# Patient Record
Sex: Female | Born: 1975 | Race: Black or African American | Hispanic: No | Marital: Married | Smoking: Never smoker
Health system: Southern US, Community
[De-identification: ages and names within clinical notes are randomized; demographics above are authoritative.]

## PROBLEM LIST (undated history)

## (undated) DIAGNOSIS — I10 Essential (primary) hypertension: Secondary | ICD-10-CM

## (undated) DIAGNOSIS — Z8742 Personal history of other diseases of the female genital tract: Secondary | ICD-10-CM

## (undated) HISTORY — DX: Essential (primary) hypertension: I10

## (undated) HISTORY — DX: Personal history of other diseases of the female genital tract: Z87.42

---

## 1997-08-30 ENCOUNTER — Other Ambulatory Visit: Admission: RE | Admit: 1997-08-30 | Discharge: 1997-08-30 | Payer: Self-pay | Admitting: Obstetrics and Gynecology

## 1998-08-28 ENCOUNTER — Other Ambulatory Visit: Admission: RE | Admit: 1998-08-28 | Discharge: 1998-08-28 | Payer: Self-pay | Admitting: *Deleted

## 1998-10-07 ENCOUNTER — Emergency Department (HOSPITAL_COMMUNITY): Admission: EM | Admit: 1998-10-07 | Discharge: 1998-10-07 | Payer: Self-pay

## 1999-07-02 ENCOUNTER — Encounter: Admission: RE | Admit: 1999-07-02 | Discharge: 1999-07-02 | Payer: Self-pay | Admitting: *Deleted

## 1999-08-29 ENCOUNTER — Other Ambulatory Visit: Admission: RE | Admit: 1999-08-29 | Discharge: 1999-08-29 | Payer: Self-pay | Admitting: Obstetrics and Gynecology

## 2000-08-28 ENCOUNTER — Other Ambulatory Visit: Admission: RE | Admit: 2000-08-28 | Discharge: 2000-08-28 | Payer: Self-pay | Admitting: Obstetrics and Gynecology

## 2001-09-09 ENCOUNTER — Other Ambulatory Visit: Admission: RE | Admit: 2001-09-09 | Discharge: 2001-09-09 | Payer: Self-pay | Admitting: Obstetrics and Gynecology

## 2002-04-06 ENCOUNTER — Inpatient Hospital Stay (HOSPITAL_COMMUNITY): Admission: AD | Admit: 2002-04-06 | Discharge: 2002-04-06 | Payer: Self-pay | Admitting: Obstetrics and Gynecology

## 2002-04-07 ENCOUNTER — Inpatient Hospital Stay (HOSPITAL_COMMUNITY): Admission: AD | Admit: 2002-04-07 | Discharge: 2002-04-09 | Payer: Self-pay | Admitting: Obstetrics and Gynecology

## 2002-12-06 ENCOUNTER — Other Ambulatory Visit: Admission: RE | Admit: 2002-12-06 | Discharge: 2002-12-06 | Payer: Self-pay | Admitting: Obstetrics and Gynecology

## 2003-12-20 ENCOUNTER — Other Ambulatory Visit: Admission: RE | Admit: 2003-12-20 | Discharge: 2003-12-20 | Payer: Self-pay | Admitting: Obstetrics and Gynecology

## 2005-01-03 ENCOUNTER — Other Ambulatory Visit: Admission: RE | Admit: 2005-01-03 | Discharge: 2005-01-03 | Payer: Self-pay | Admitting: Obstetrics and Gynecology

## 2005-11-15 ENCOUNTER — Emergency Department (HOSPITAL_COMMUNITY): Admission: EM | Admit: 2005-11-15 | Discharge: 2005-11-15 | Payer: Self-pay | Admitting: Family Medicine

## 2006-02-05 ENCOUNTER — Other Ambulatory Visit: Admission: RE | Admit: 2006-02-05 | Discharge: 2006-02-05 | Payer: Self-pay | Admitting: Obstetrics and Gynecology

## 2006-06-09 ENCOUNTER — Inpatient Hospital Stay (HOSPITAL_COMMUNITY): Admission: AD | Admit: 2006-06-09 | Discharge: 2006-06-09 | Payer: Self-pay | Admitting: Obstetrics and Gynecology

## 2006-07-16 ENCOUNTER — Inpatient Hospital Stay (HOSPITAL_COMMUNITY): Admission: AD | Admit: 2006-07-16 | Discharge: 2006-07-19 | Payer: Self-pay | Admitting: Obstetrics and Gynecology

## 2007-04-09 HISTORY — PX: ENDOMETRIAL ABLATION: SHX621

## 2008-03-01 ENCOUNTER — Encounter (INDEPENDENT_AMBULATORY_CARE_PROVIDER_SITE_OTHER): Payer: Self-pay | Admitting: Obstetrics and Gynecology

## 2008-03-01 ENCOUNTER — Ambulatory Visit (HOSPITAL_COMMUNITY): Admission: RE | Admit: 2008-03-01 | Discharge: 2008-03-01 | Payer: Self-pay | Admitting: Obstetrics and Gynecology

## 2008-03-30 IMAGING — CR DG CHEST 2V
2 series · 2 of 2 positions shown · non-contrast
Comparison: None.

CLINICAL DATA: Chest pain, 33 weeks pregnant. 
 CHEST - 2 VIEW:

[view not recorded (1 of 2)]
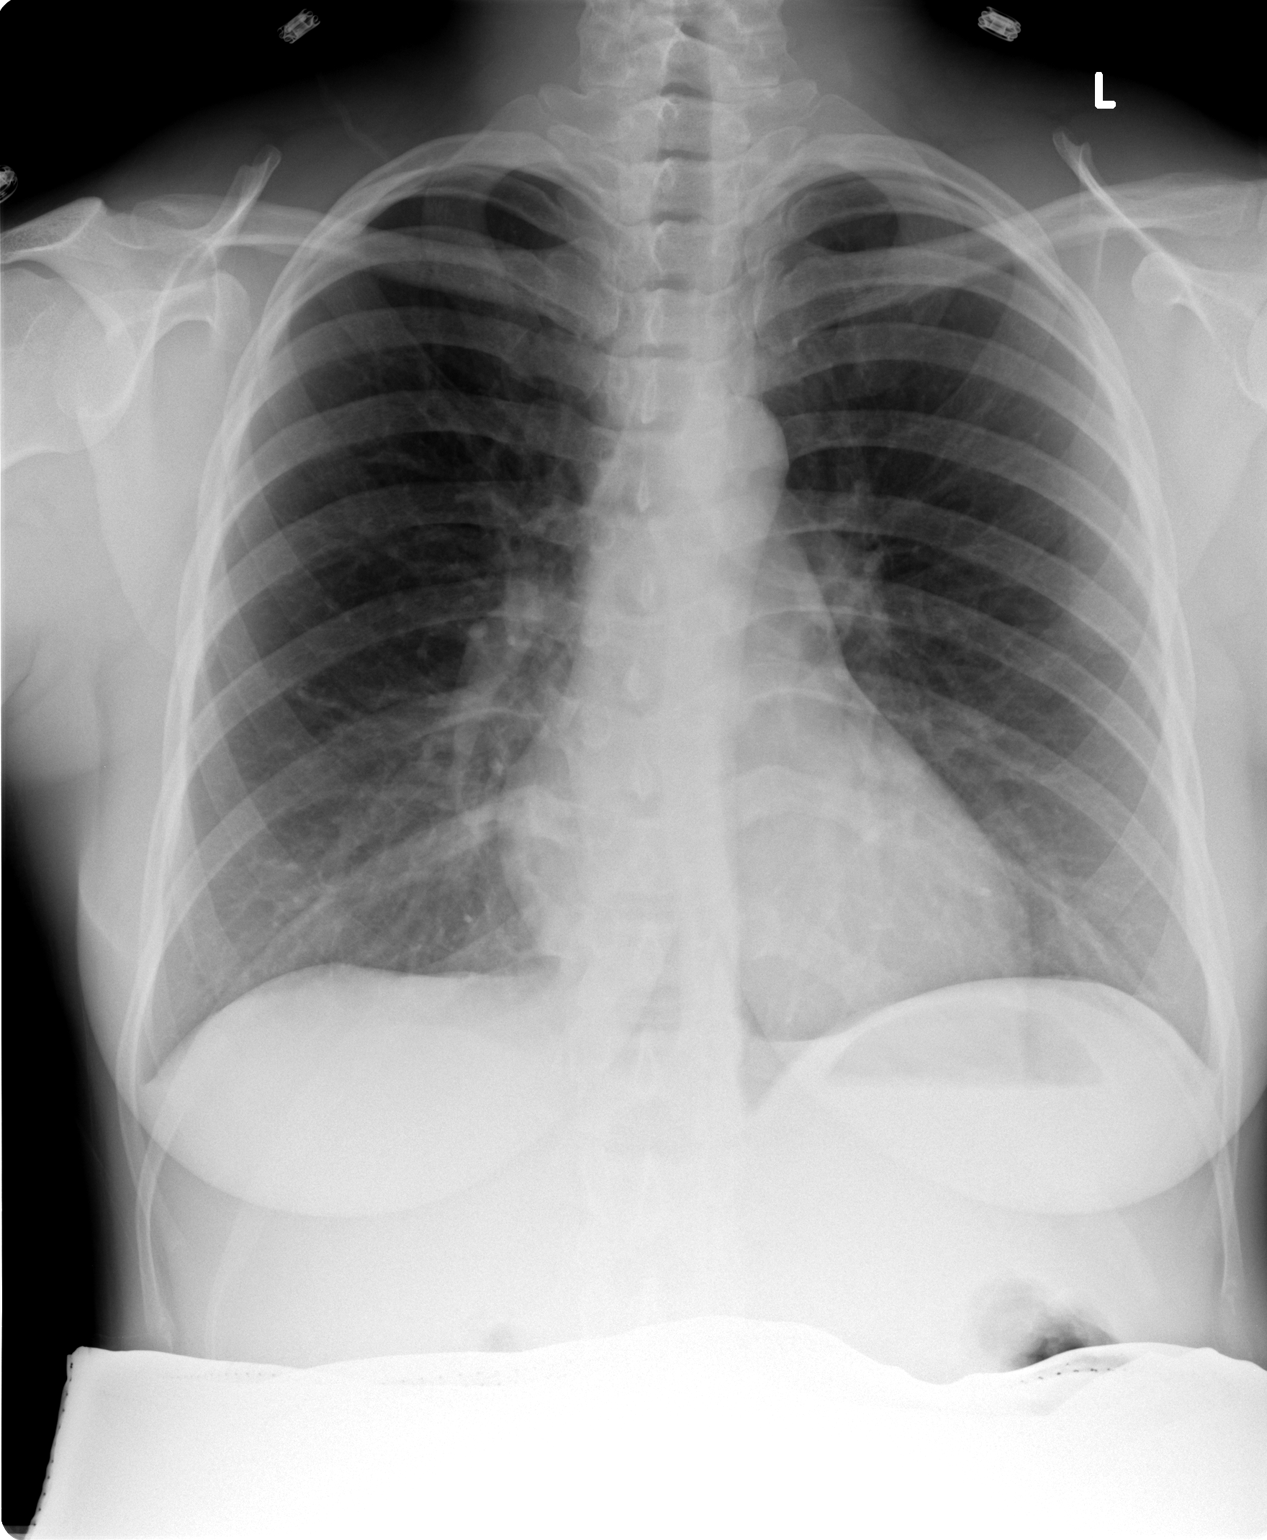

[view not recorded (2 of 2)]
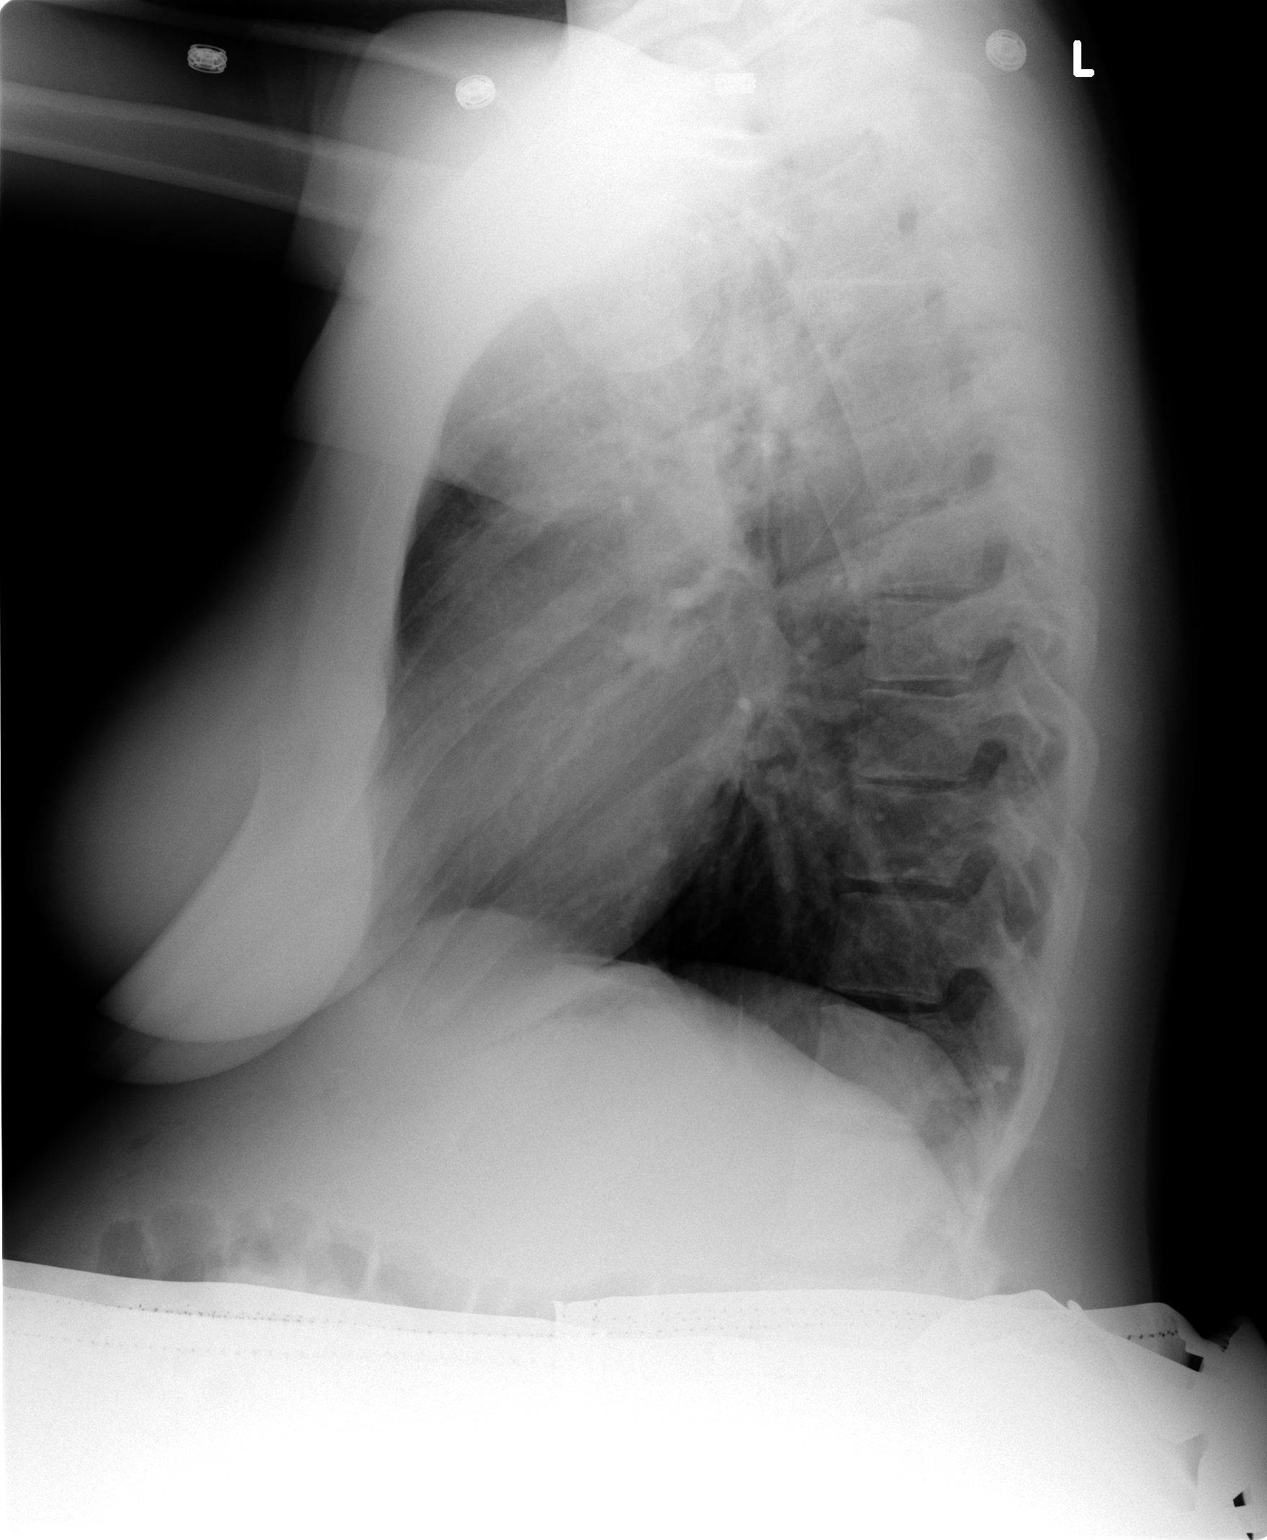

[2 of 2 positions shown; findings below may reference images not displayed]

FINDINGS: The patient was shielded for this study.  
 The heart size and mediastinal contours are within normal limits.  Both lungs are clear.  The visualized skeletal structures are unremarkable.
IMPRESSION: No active cardiopulmonary disease.

## 2010-08-21 NOTE — Op Note (Signed)
NAMEDESTYNEE, STRINGFELLOW               ACCOUNT NO.:  [id number]   MEDICAL RECORD NO.:  [id number]          PATIENT TYPE:  AMB   LOCATION:  SDC                           FACILITY:  [facility]   PHYSICIAN:  Osborn Coho, M.D.   DATE OF BIRTH:  [birth year]   DATE OF PROCEDURE:  DATE OF DISCHARGE:                               OPERATIVE REPORT   PREOPERATIVE DIAGNOSES:  1. Menorrhagia.  2. Fibroids.   POSTOPERATIVE DIAGNOSIS:  1. Menorrhagia.  2. Fibroids.   PROCEDURE:  1. Hysteroscopy.  2. Dilation and curettage.  3. Resection of fibroid.  4. Ablation via NovaSure.   ATTENDING:  Osborn Coho, MD   ANESTHESIA:  General via LMA.   SPECIMENS TO PATHOLOGY:  1. Endometrial curettings.  2. Resection of fibroid.   FINDINGS:  Left lateral submucosal fibroid.   FLUIDS:  1100 mL.   URINE OUTPUT:  Quantity sufficient via straight cath prior to procedure.   ESTIMATED BLOOD LOSS:  Minimal.   COMPLICATIONS:  None.   HYSTEROSCOPIC FLUID DEFICIT:  165 mL.   PROCEDURE:  The patient was taken to the operating room after risks,  benefits, and alternatives were discussed with the patient, the patient  verbalized understanding, and consent signed and witnessed.  The patient  was placed under general per Anesthesia and prepped and draped in a  normal sterile fashion in the dorsal lithotomy position.  A bivalve  speculum was placed in the patient's vagina and the anterior lip of the  cervix was grasped with a single-tooth tenaculum.  A paracervical block  was administered using a total of 10 mL of 1% lidocaine.  The uterus was  sounded to 10.5 cm and cervix measured 4 cm.  The hysteroscope was  introduced and findings as noted above.  Curettage was performed, so a  gritty texture was noted.  The cervix was then dilated for passage of  the NovaSure instrument.  The NovaSure instrument was introduced and  ablation performed without difficulty.  The hysteroscope was introduced  once again and  left lateral fibroid noted once again with small area  around the fibroid, which did not get adequately ablated.  Decision was  made to resect this area.  Lactated Ringer were changed to sorbitol and  resection was performed of the left lateral fibroid without difficulty  and the erythematous area surrounding that area was cauterized as well.  The remainder of the uterus had good ablation results.  All instruments  were removed.  There was some bleeding at tenaculum sites, which were  tried to be made hemostatic with silver nitrate; however, there was  still some bleeding from the right tenaculum site, therefore a figure-of-  eight stitch of 3-0 chromic was placed and good hemostasis noted.  Sponge, lap, and needle count was correct.  The patient tolerated the  procedure well and was returned to the recovery room in good condition.      Osborn Coho, M.D.  Electronically Signed     AR/MEDQ  D:  03/01/2008  T:  03/01/2008  Job:  [id number]

## 2010-08-24 NOTE — H&P (Signed)
Andrea Arias, Andrea Arias               ACCOUNT NO.:  [id number]   MEDICAL RECORD NO.:  [id number]          PATIENT TYPE:  INP   LOCATION:  9169                          FACILITY:  [facility]   PHYSICIAN:  Janine Limbo, M.D.DATE OF BIRTH:  [birth year]   DATE OF ADMISSION:  07/16/2006  DATE OF DISCHARGE:                              HISTORY & PHYSICAL   This is a 35 year old gravida 2, para 1-0-0-1 at 38-2/7th weeks who  presents with leaking fluid and onset of contractions.  She reports  positive fetal movement.  Pregnancy has been followed by the nurse  midwife service and remarkable for 1) bilateral fetal pyelectasis, 2)  sickle cell trait, 3) group B strep negative.   ALLERGIES:  None.   OBSTETRICAL HISTORY:  The patient had a vaginal delivery in 2003 of a  female infant at [redacted] weeks gestation, weighing 7 pounds 7 ounces with no  complications.   PAST MEDICAL HISTORY:  Remarkable for pregnancy-induced hypertension in  the postpartum period and childhood varicella.  She has been on and off  medications for her hypertension since 2003.   PAST SURGICAL HISTORY:  Remarkable for wisdom teeth in 1994.   FAMILY HISTORY:  Remarkable for grandmother and grandfather with  diabetes.   GENETIC HISTORY:  Remarkable for patient with sickle cell trait and  husband who does not have trait.   SOCIAL HISTORY:  The patient is married to Andrea Arias who is  involved and supportive.  She is of the [place] faith.  She denies any  alcohol, tobacco or drug use.   PRENATAL LABS:  Hemoglobin 13, platelets 307, blood type A positive,  antibody screen negative, sickle cell negative.  RPR nonreactive.  Rubella immuned.  Hepatitis negative.  Gonorrhea and Chlamydia declined.   HISTORY OF CURRENT PREGNANCY:  The patient entered care at [redacted] weeks  gestation.  She had an ultrasound at 7 weeks showing viable fetus and  also that she had a fibroid which was 1.8 cm.  She declined her quad  screen.  She had a  normal Pap.  She had anatomy ultrasound at 19 weeks  which was normal except for bilateral pyelectasis.  She had a normal  Glucola at 27 weeks.  An ultrasound at 31 weeks showed resolution of  pyelectasis but a new hydrocele in the baby.  She had some shortness of  breath at 33 weeks with a cardiology workup that was negative and her  group B strep was negative at term.   OBJECTIVE DATA:  VITAL SIGNS:  Stable, afebrile.  HEENT:  Within normal limits.  Thyroid normal, not enlarged.  CHEST:  Clear to auscultation.  HEART:  Regular rate and rhythm.  ABDOMEN:  Gravid at 38 cm, vertex Leopold's.  EFM shows reactive fetal  heart rate with contractions every 4 minutes.  Cervix is 2 cm, 80%, -2  station with a vertex presentation.  There is clear mucus draining from  the vagina, positive pooling, positive ferning, positive Nitrazine.  EXTREMITIES:  Within normal limits.   ASSESSMENT:  1. Intrauterine pregnancy at 38-2/7th weeks.  2. Spontaneous rupture  of membranes.  3. Early labor.   PLAN:  1. Admit to birthing suite.  Dr. Stefano Gaul notified.  2. Routine CNM orders.  3. Desires epidural.      Elby Showers. Williams, C.N.M.      Janine Limbo, M.D.  Electronically Signed    MLW/MEDQ  D:  07/16/2006  T:  07/17/2006  Job:  [id number]

## 2010-08-24 NOTE — H&P (Signed)
   NAME:  Andrea Arias, Andrea Arias                         ACCOUNT NO.:  [id number]   MEDICAL RECORD NO.:  [id number]                   PATIENT TYPE:  MAT   LOCATION:  MATC                                 [facility]ACILITY:  [facility]   PHYSICIAN:  Osborn Coho, M.D.                DATE OF BIRTH:  1975-05-15   DATE OF ADMISSION:  04/07/2002  DATE OF DISCHARGE:                                HISTORY & PHYSICAL   HISTORY OF PRESENT ILLNESS:  The patient is a 35 year old gravida 1, para 0  at 40-4/7 weeks who presents with complaint of spontaneous rupture of  membranes at 7:30 AM with accompanying contractions. She reports positive  fetal movement.  Her pregnancy has been followed by the nurse midwife  service and remarkable for:  (1)  Irregular cycles; (2) Sickle cell trait;  (3) Fibroids; (4) Group B Strep negative.   OBSTETRICAL HISTORY:  Patient is a primigravida.   PAST MEDICAL HISTORY:  Remarkable for childhood varicella, history of sickle  cell trait, history of asthma as a child for which she uses no current  treatment.   PAST SURGICAL HISTORY:  Remarkable for wisdom teeth extraction in 1994.   FAMILY HISTORY:  Remarkable for grandmother with myocardial infarction and  hypertension and diabetes.  Grandfather with diabetes.  Genetic history is  remarkable for patient with sickle cell trait.  Report pending on father of  the baby sickle status.   SOCIAL HISTORY:  The patient is married to Langston Reusing who is involved  and supportive.  She is of the [place] faith.  She denies any alcohol,  tobacco or drug use.   PHYSICAL EXAMINATION:  VITAL SIGNS:  Stable.  Afebrile.  HEENT:  Within normal limits.  NECK:  Thyroid normal, not enlarged.  CHEST:  Clear to auscultation.  HEART:  Regular rate and rhythm.  ABDOMEN:  Gravid at 40 cm. Vertex _____________.  EFM shows fetal heart rate  of 140/150, nonreactive with no decelerations.  Uterine contractions every  two minutes and strong.  CERVIX:  4 cm  and 90% effaced, minus 1 station with vertex presentation and  moderate yellow/green meconium fluid noted.  EXTREMITIES:  Within normal limits.   ASSESSMENT:  1. Intrauterine pregnancy at 40-4/7 weeks.,  2. Active labor.  3. Spontaneous rupture of membranes of meconium stained amniotic fluid.   PLAN:  1. Admit to birthing suite, Dr. Su Hilt notified.  2.     Routine C.N.M. orders.  3. Epidural per patient's request.  4. Amnio infusion for meconium stained fluid.     Marie L. Williams, C.N.M.                 Osborn Coho, M.D.    MLW/MEDQ  D:  04/07/2002  T:  04/07/2002  Job:  [id number]

## 2010-09-01 ENCOUNTER — Ambulatory Visit (INDEPENDENT_AMBULATORY_CARE_PROVIDER_SITE_OTHER): Payer: BC Managed Care – PPO

## 2010-09-01 ENCOUNTER — Inpatient Hospital Stay (INDEPENDENT_AMBULATORY_CARE_PROVIDER_SITE_OTHER)
Admission: RE | Admit: 2010-09-01 | Discharge: 2010-09-01 | Disposition: A | Discharge status: Home / Self Care | Payer: BC Managed Care – PPO | Source: Ambulatory Visit | Attending: Family Medicine | Admitting: Family Medicine

## 2010-09-01 DIAGNOSIS — S92909A Unspecified fracture of unspecified foot, initial encounter for closed fracture: Secondary | ICD-10-CM

## 2011-01-08 LAB — CBC
Hemoglobin: 12.1
MCHC: 33.6
MCV: 86.5
RBC: 4.17

## 2012-02-17 ENCOUNTER — Ambulatory Visit (INDEPENDENT_AMBULATORY_CARE_PROVIDER_SITE_OTHER): Payer: BC Managed Care – PPO | Admitting: Obstetrics and Gynecology

## 2012-02-17 ENCOUNTER — Encounter: Payer: Self-pay | Admitting: Obstetrics and Gynecology

## 2012-02-17 VITALS — BP 120/72 | Resp 12 | Ht 66.5 in | Wt 160.0 lb

## 2012-02-17 DIAGNOSIS — Z124 Encounter for screening for malignant neoplasm of cervix: Secondary | ICD-10-CM

## 2012-02-17 NOTE — Progress Notes (Signed)
Subjective:    Andrea Arias is a 36 y.o. female, 2 vaginal deliveries, who presents for an annual exam.  She has no c/o, she is employed FT and married. She sees a primary care Dr for management of HTN she is on diovan, and states she is being weaned off it.  She has hx of ablation and has not had menses since than.     History   Social History  . Marital Status: Married    Spouse Name: N/A    Number of Children: N/A  . Years of Education: N/A   Social History Main Topics  . Smoking status: Never Smoker   . Smokeless tobacco: Never Used  . Alcohol Use: No  . Drug Use: No  . Sexually Active: Yes -- Female partner(s)    Birth Control/ Protection: None   Other Topics Concern  . None   Social History Narrative  . None    Menstrual cycle:   LMP: Patient's last menstrual period was 03/18/2008.           Cycle: none  The following portions of the patient's history were reviewed and updated as appropriate: allergies, current medications, past family history, past medical history, past social history, past surgical history and problem list.  Review of Systems Pertinent items are noted in HPI. Breast:Negative for breast lump,nipple discharge or nipple retraction Gastrointestinal: Negative for abdominal pain, change in bowel habits or rectal bleeding Urinary:negative   Objective:    BP 120/72  Resp 12  Ht 5' 6.5" (1.689 m)  Wt 160 lb (72.576 kg)  BMI 25.44 kg/m2  LMP 03/18/2008    Weight:  Wt Readings from Last 1 Encounters:  02/17/12 160 lb (72.576 kg)          BMI: Body mass index is 25.44 kg/(m^2).  General Appearance: Alert, appropriate appearance for age. No acute distress HEENT: Grossly normal Neck / Thyroid: Supple, no masses, nodes or enlargement Lungs: clear to auscultation bilaterally Back: No CVA tenderness Breast Exam: :"No masses or nodes.No dimpling, nipple retraction or discharge."} Cardiovascular: Regular rate and rhythm. S1, S2, no  murmur Gastrointestinal: Soft, non-tender, no masses or organomegaly Pelvic Exam: Vulva and vagina appear normal. Bimanual exam reveals normal uterus and adnexa. Cervix: thin prep PAP obtained and presence of blood from cervical os Rectovaginal: not indicated Lymphatic Exam: Non-palpable nodes in neck, clavicular, axillary, or inguinal regions Skin: no rash or abnormalities Neurologic: Normal gait and speech, no tremor  Psychiatric: Alert and oriented, appropriate affect.   Wet Prep:not applicable Urinalysis:not applicable UPT: Not done   Assessment:    Normal gyn exam    Plan:    pap smear return annually or prn Enc SBE STD screening: declined Contraception:no method RTO 1 year or PRN   S.Vondra Aldredge, CNM

## 2012-02-17 NOTE — Progress Notes (Signed)
The patient reports:no complaints  Contraception:no method, ABLATION  Last mammogram: patient has never had a mammogram and not applicable  Last pap: was normal and not applicable March  2012  GC/Chlamydia cultures offered: declined HIV/RPR/HbsAg offered:  declined HSV 1 and 2 glycoprotein offered: declined  Menstrual cycle regular and monthly: No:  Menstrual flow normal: N/A  Urinary symptoms: none Normal bowel movements: Yes Reports abuse at home: No:

## 2012-02-19 LAB — PAP IG W/ RFLX HPV ASCU

## 2012-06-22 IMAGING — CR DG ANKLE COMPLETE 3+V*L*
3 series · 3 of 3 positions shown · non-contrast
Comparison: None.

CLINICAL DATA: Left ankle pain since a twisting injury this
morning.

LEFT ANKLE COMPLETE - 3+ VIEW

[view not recorded (1 of 3)]
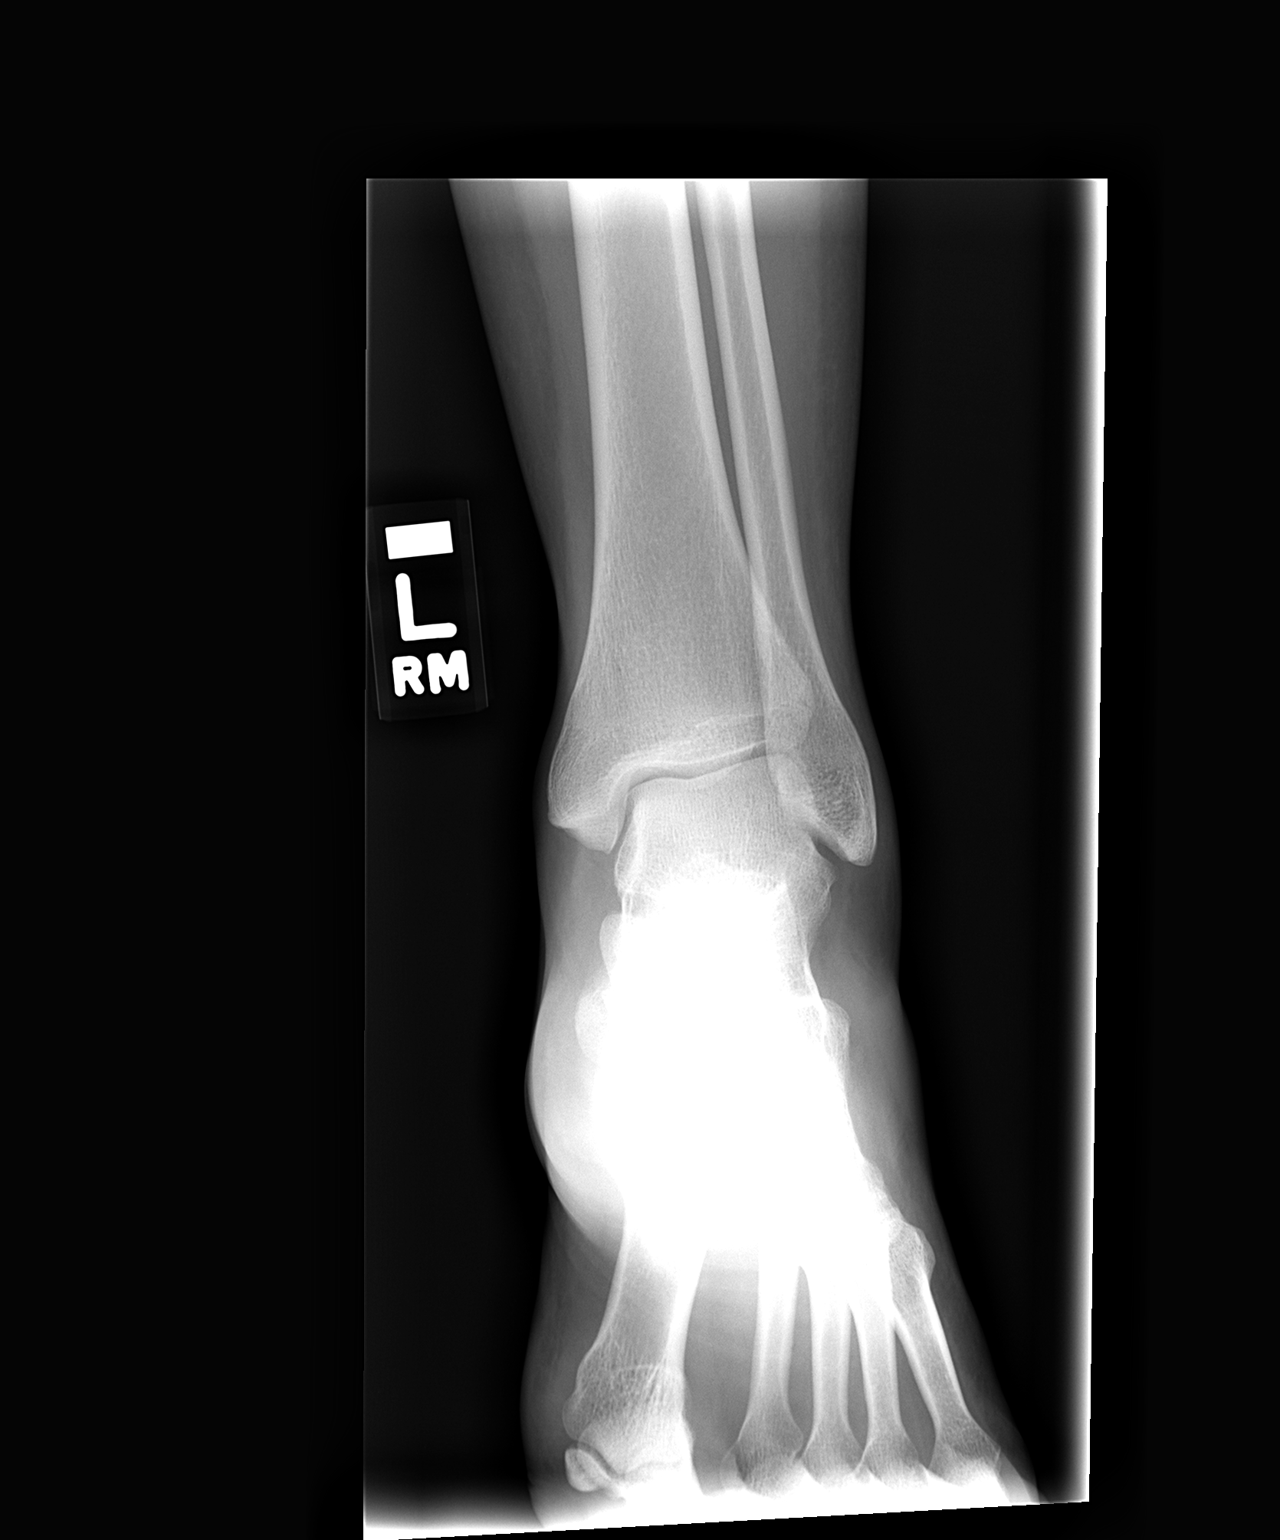

[view not recorded (2 of 3)]
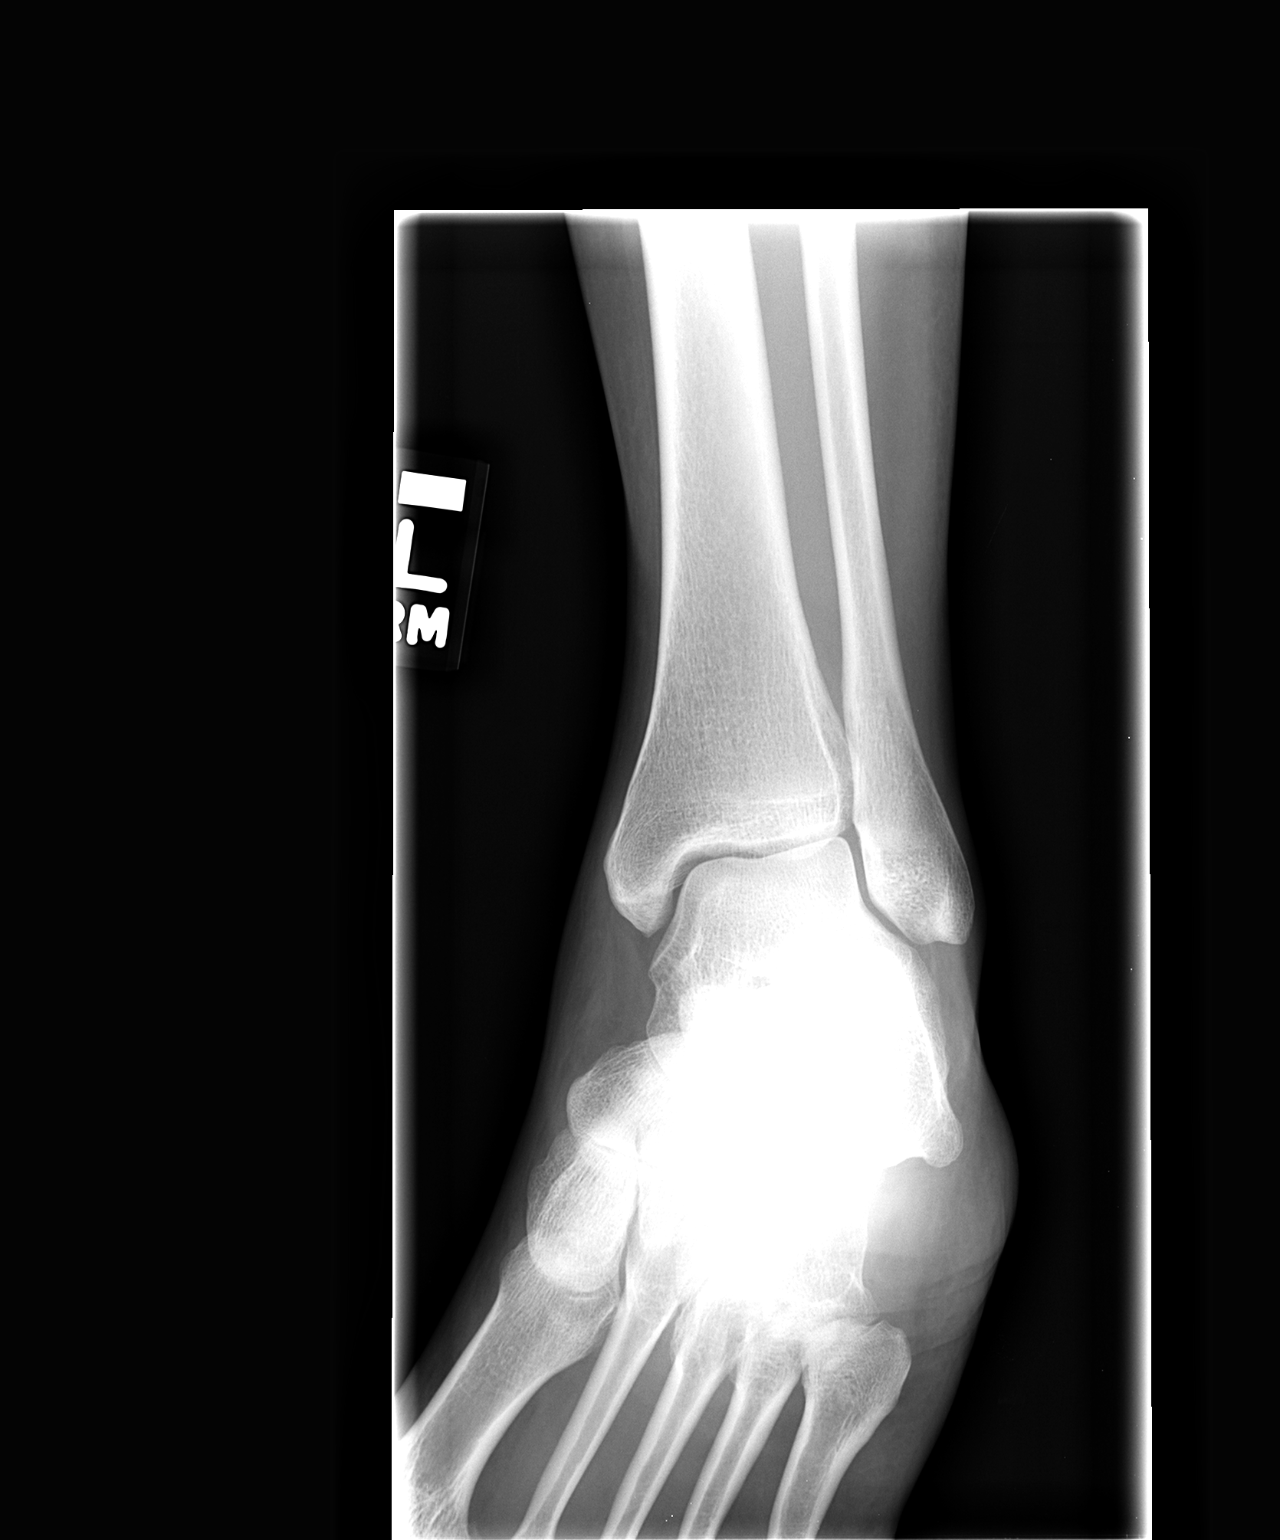

[view not recorded (3 of 3)]
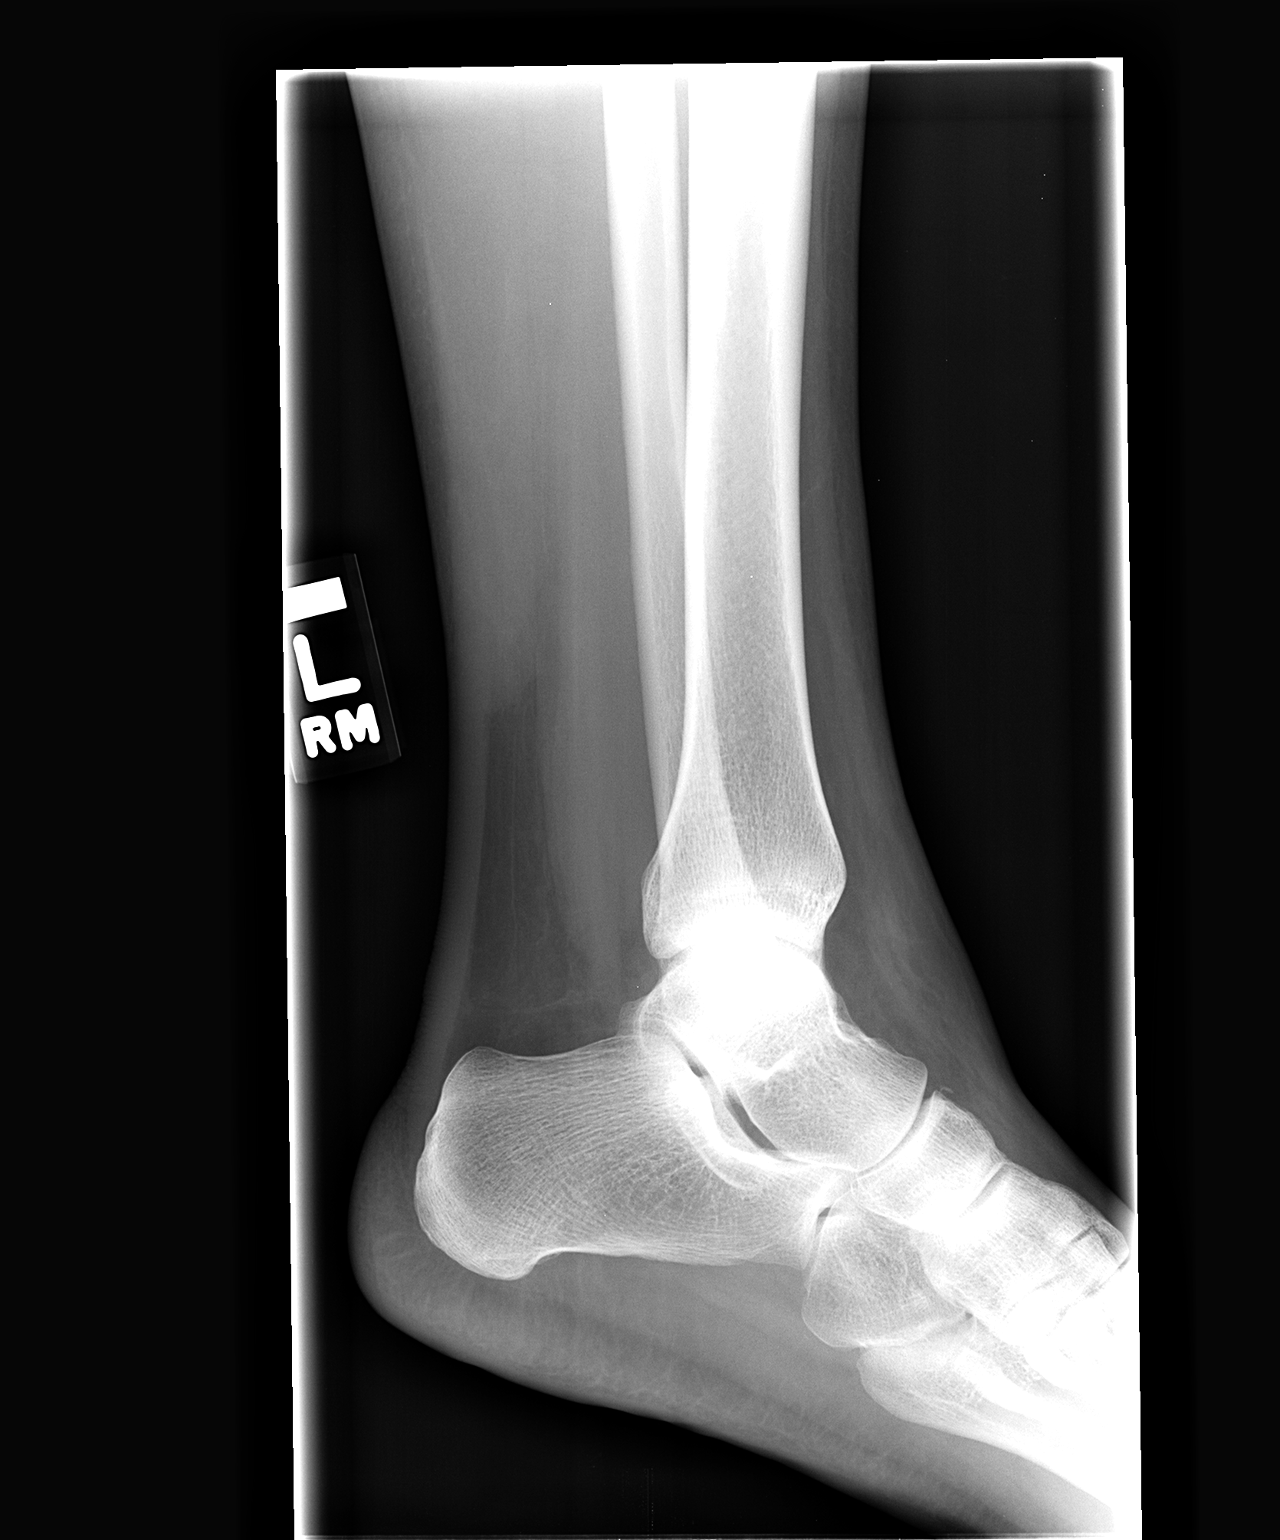

[3 of 3 positions shown; findings below may reference images not displayed]

FINDINGS: There is soft tissue swelling anteriorly at the ankle
with a tiny avulsion from the proximal dorsal aspect of the
navicular.

The ankle joint appears normal with no effusion.
IMPRESSION: Tiny avulsion from the dorsal aspect of the navicular.  Soft tissue
swelling.

## 2013-11-15 ENCOUNTER — Other Ambulatory Visit: Payer: Self-pay | Admitting: Dermatology

## 2014-01-13 ENCOUNTER — Emergency Department (HOSPITAL_COMMUNITY)
Admission: EM | Admit: 2014-01-13 | Discharge: 2014-01-13 | Disposition: A | Discharge status: Home / Self Care | Payer: BC Managed Care – PPO | Source: Home / Self Care | Attending: Family Medicine | Admitting: Family Medicine

## 2014-01-13 ENCOUNTER — Encounter (HOSPITAL_COMMUNITY): Payer: Self-pay | Admitting: Emergency Medicine

## 2014-01-13 DIAGNOSIS — M792 Neuralgia and neuritis, unspecified: Secondary | ICD-10-CM

## 2014-01-13 DIAGNOSIS — M5412 Radiculopathy, cervical region: Secondary | ICD-10-CM

## 2014-01-13 NOTE — ED Notes (Signed)
Reports injury to left thumb three weeks ago.  States hit thumb on sink.  Pt is c/o sharp pain with certain movents and pain with palpation.

## 2014-01-13 NOTE — ED Provider Notes (Signed)
CSN: [id number]     Arrival date & time 01/13/14  1618 History   First MD Initiated Contact with Patient 01/13/14 1722     Chief Complaint  Patient presents with  . Hand Injury   (Consider location/radiation/quality/duration/timing/severity/associated sxs/prior Treatment) Patient is a 38 y.o. female presenting with hand injury. The history is provided by the patient.  Hand Injury Location:  Finger Time since incident:  3 weeks Injury: yes   Mechanism of injury comment:  Struck thumb on sink and continues to have shooting pains and tingling when she hits it. Finger location:  L thumb Pain details:    Quality:  Tingling   Radiates to:  Does not radiate   Severity:  Mild   Onset quality:  Sudden Chronicity:  New Dislocation: no   Foreign body present:  No foreign bodies Prior injury to area:  No Relieved by:  None tried Worsened by:  Nothing tried Ineffective treatments:  None tried Associated symptoms: no numbness     Past Medical History  Diagnosis Date  . Hypertension   . History of heavy periods    Past Surgical History  Procedure Laterality Date  . Endometrial ablation  2009   Family History  Problem Relation Age of Onset  . Diabetes Maternal Grandmother   . Diabetes Paternal Grandfather    History  Substance Use Topics  . Smoking status: Never Smoker   . Smokeless tobacco: Never Used  . Alcohol Use: No   OB History   Grav Para Term Preterm Abortions TAB SAB Ect Mult Living                 Review of Systems  Constitutional: Negative.   Musculoskeletal: Negative.   Skin: Negative.     Allergies  Review of patient's allergies indicates no known allergies.  Home Medications   Prior to Admission medications   Medication Sig Start Date End Date Taking? Authorizing Provider  valsartan-hydrochlorothiazide (DIOVAN-HCT) 80-12.5 MG per tablet  12/30/11  Yes Historical Provider, MD  Valsartan (DIOVAN PO) Take by mouth.    Historical Provider, MD   BP  183/107  Pulse 60  Temp(Src) 97.5 F (36.4 C) (Oral)  Resp 16  SpO2 100% Physical Exam  Nursing note and vitals reviewed. Constitutional: She is oriented to person, place, and time. She appears well-developed and well-nourished.  Musculoskeletal: Normal range of motion. She exhibits no tenderness.       Hands: Neurological: She is alert and oriented to person, place, and time.  Skin: Skin is warm and dry.    ED Course  Procedures (including critical care time) Labs Review Labs Reviewed - No data to display  Imaging Review No results found.   MDM   1. Neuralgia of upper extremity        Linna HoffJames D Nevayah Faust, MD 01/14/14 [phone]

## 2014-01-13 NOTE — Discharge Instructions (Signed)
Protect from injury, heat, return as needed.

## 2017-02-14 DIAGNOSIS — I10 Essential (primary) hypertension: Secondary | ICD-10-CM | POA: Diagnosis not present

## 2017-03-14 DIAGNOSIS — L820 Inflamed seborrheic keratosis: Secondary | ICD-10-CM | POA: Diagnosis not present

## 2017-05-07 DIAGNOSIS — L820 Inflamed seborrheic keratosis: Secondary | ICD-10-CM | POA: Diagnosis not present

## 2017-08-15 DIAGNOSIS — I10 Essential (primary) hypertension: Secondary | ICD-10-CM | POA: Diagnosis not present

## 2018-01-15 DIAGNOSIS — Z01411 Encounter for gynecological examination (general) (routine) with abnormal findings: Secondary | ICD-10-CM | POA: Diagnosis not present

## 2018-01-15 DIAGNOSIS — D259 Leiomyoma of uterus, unspecified: Secondary | ICD-10-CM | POA: Diagnosis not present

## 2018-01-15 DIAGNOSIS — Z124 Encounter for screening for malignant neoplasm of cervix: Secondary | ICD-10-CM | POA: Diagnosis not present

## 2018-01-15 DIAGNOSIS — Z6826 Body mass index (BMI) 26.0-26.9, adult: Secondary | ICD-10-CM | POA: Diagnosis not present

## 2018-01-15 DIAGNOSIS — Z1231 Encounter for screening mammogram for malignant neoplasm of breast: Secondary | ICD-10-CM | POA: Diagnosis not present

## 2018-02-04 ENCOUNTER — Other Ambulatory Visit: Payer: Self-pay | Admitting: Obstetrics and Gynecology

## 2018-02-04 DIAGNOSIS — L308 Other specified dermatitis: Secondary | ICD-10-CM | POA: Diagnosis not present

## 2018-02-04 DIAGNOSIS — N9089 Other specified noninflammatory disorders of vulva and perineum: Secondary | ICD-10-CM | POA: Diagnosis not present

## 2018-02-20 DIAGNOSIS — I10 Essential (primary) hypertension: Secondary | ICD-10-CM | POA: Diagnosis not present

## 2018-08-20 DIAGNOSIS — L280 Lichen simplex chronicus: Secondary | ICD-10-CM | POA: Diagnosis not present

## 2018-09-15 DIAGNOSIS — H35033 Hypertensive retinopathy, bilateral: Secondary | ICD-10-CM | POA: Diagnosis not present

## 2018-09-18 DIAGNOSIS — M545 Low back pain: Secondary | ICD-10-CM | POA: Diagnosis not present

## 2018-09-18 DIAGNOSIS — M2560 Stiffness of unspecified joint, not elsewhere classified: Secondary | ICD-10-CM | POA: Diagnosis not present

## 2018-09-18 DIAGNOSIS — M9903 Segmental and somatic dysfunction of lumbar region: Secondary | ICD-10-CM | POA: Diagnosis not present

## 2018-09-18 DIAGNOSIS — R293 Abnormal posture: Secondary | ICD-10-CM | POA: Diagnosis not present

## 2018-09-24 DIAGNOSIS — M2560 Stiffness of unspecified joint, not elsewhere classified: Secondary | ICD-10-CM | POA: Diagnosis not present

## 2018-09-24 DIAGNOSIS — M545 Low back pain: Secondary | ICD-10-CM | POA: Diagnosis not present

## 2018-09-24 DIAGNOSIS — M9903 Segmental and somatic dysfunction of lumbar region: Secondary | ICD-10-CM | POA: Diagnosis not present

## 2018-09-24 DIAGNOSIS — R293 Abnormal posture: Secondary | ICD-10-CM | POA: Diagnosis not present

## 2018-10-05 DIAGNOSIS — M2560 Stiffness of unspecified joint, not elsewhere classified: Secondary | ICD-10-CM | POA: Diagnosis not present

## 2018-10-05 DIAGNOSIS — M9903 Segmental and somatic dysfunction of lumbar region: Secondary | ICD-10-CM | POA: Diagnosis not present

## 2018-10-05 DIAGNOSIS — R293 Abnormal posture: Secondary | ICD-10-CM | POA: Diagnosis not present

## 2018-10-05 DIAGNOSIS — M545 Low back pain: Secondary | ICD-10-CM | POA: Diagnosis not present

## 2018-10-06 DIAGNOSIS — M545 Low back pain: Secondary | ICD-10-CM | POA: Diagnosis not present

## 2018-10-06 DIAGNOSIS — M9903 Segmental and somatic dysfunction of lumbar region: Secondary | ICD-10-CM | POA: Diagnosis not present

## 2018-10-06 DIAGNOSIS — R293 Abnormal posture: Secondary | ICD-10-CM | POA: Diagnosis not present

## 2018-10-06 DIAGNOSIS — M2560 Stiffness of unspecified joint, not elsewhere classified: Secondary | ICD-10-CM | POA: Diagnosis not present

## 2018-10-12 DIAGNOSIS — M545 Low back pain: Secondary | ICD-10-CM | POA: Diagnosis not present

## 2018-10-12 DIAGNOSIS — R293 Abnormal posture: Secondary | ICD-10-CM | POA: Diagnosis not present

## 2018-10-12 DIAGNOSIS — M9903 Segmental and somatic dysfunction of lumbar region: Secondary | ICD-10-CM | POA: Diagnosis not present

## 2018-10-12 DIAGNOSIS — M2560 Stiffness of unspecified joint, not elsewhere classified: Secondary | ICD-10-CM | POA: Diagnosis not present

## 2018-10-14 DIAGNOSIS — M9903 Segmental and somatic dysfunction of lumbar region: Secondary | ICD-10-CM | POA: Diagnosis not present

## 2018-10-14 DIAGNOSIS — R293 Abnormal posture: Secondary | ICD-10-CM | POA: Diagnosis not present

## 2018-10-14 DIAGNOSIS — L438 Other lichen planus: Secondary | ICD-10-CM | POA: Diagnosis not present

## 2018-10-14 DIAGNOSIS — M545 Low back pain: Secondary | ICD-10-CM | POA: Diagnosis not present

## 2018-10-14 DIAGNOSIS — M2560 Stiffness of unspecified joint, not elsewhere classified: Secondary | ICD-10-CM | POA: Diagnosis not present

## 2018-10-15 DIAGNOSIS — M545 Low back pain: Secondary | ICD-10-CM | POA: Diagnosis not present

## 2018-10-15 DIAGNOSIS — R293 Abnormal posture: Secondary | ICD-10-CM | POA: Diagnosis not present

## 2018-10-15 DIAGNOSIS — M2560 Stiffness of unspecified joint, not elsewhere classified: Secondary | ICD-10-CM | POA: Diagnosis not present

## 2018-10-15 DIAGNOSIS — M9903 Segmental and somatic dysfunction of lumbar region: Secondary | ICD-10-CM | POA: Diagnosis not present

## 2018-10-19 DIAGNOSIS — M2560 Stiffness of unspecified joint, not elsewhere classified: Secondary | ICD-10-CM | POA: Diagnosis not present

## 2018-10-19 DIAGNOSIS — M545 Low back pain: Secondary | ICD-10-CM | POA: Diagnosis not present

## 2018-10-19 DIAGNOSIS — R293 Abnormal posture: Secondary | ICD-10-CM | POA: Diagnosis not present

## 2018-10-19 DIAGNOSIS — M9903 Segmental and somatic dysfunction of lumbar region: Secondary | ICD-10-CM | POA: Diagnosis not present

## 2018-10-21 DIAGNOSIS — R293 Abnormal posture: Secondary | ICD-10-CM | POA: Diagnosis not present

## 2018-10-21 DIAGNOSIS — M545 Low back pain: Secondary | ICD-10-CM | POA: Diagnosis not present

## 2018-10-21 DIAGNOSIS — M9903 Segmental and somatic dysfunction of lumbar region: Secondary | ICD-10-CM | POA: Diagnosis not present

## 2018-10-21 DIAGNOSIS — M2560 Stiffness of unspecified joint, not elsewhere classified: Secondary | ICD-10-CM | POA: Diagnosis not present

## 2018-10-22 DIAGNOSIS — R293 Abnormal posture: Secondary | ICD-10-CM | POA: Diagnosis not present

## 2018-10-22 DIAGNOSIS — M9903 Segmental and somatic dysfunction of lumbar region: Secondary | ICD-10-CM | POA: Diagnosis not present

## 2018-10-22 DIAGNOSIS — M2560 Stiffness of unspecified joint, not elsewhere classified: Secondary | ICD-10-CM | POA: Diagnosis not present

## 2018-10-22 DIAGNOSIS — M545 Low back pain: Secondary | ICD-10-CM | POA: Diagnosis not present

## 2018-10-26 DIAGNOSIS — M2560 Stiffness of unspecified joint, not elsewhere classified: Secondary | ICD-10-CM | POA: Diagnosis not present

## 2018-10-26 DIAGNOSIS — M9903 Segmental and somatic dysfunction of lumbar region: Secondary | ICD-10-CM | POA: Diagnosis not present

## 2018-10-26 DIAGNOSIS — M545 Low back pain: Secondary | ICD-10-CM | POA: Diagnosis not present

## 2018-10-26 DIAGNOSIS — R293 Abnormal posture: Secondary | ICD-10-CM | POA: Diagnosis not present

## 2018-10-28 DIAGNOSIS — M9903 Segmental and somatic dysfunction of lumbar region: Secondary | ICD-10-CM | POA: Diagnosis not present

## 2018-10-28 DIAGNOSIS — M545 Low back pain: Secondary | ICD-10-CM | POA: Diagnosis not present

## 2018-10-28 DIAGNOSIS — R293 Abnormal posture: Secondary | ICD-10-CM | POA: Diagnosis not present

## 2018-10-28 DIAGNOSIS — M2560 Stiffness of unspecified joint, not elsewhere classified: Secondary | ICD-10-CM | POA: Diagnosis not present

## 2018-10-29 DIAGNOSIS — M9903 Segmental and somatic dysfunction of lumbar region: Secondary | ICD-10-CM | POA: Diagnosis not present

## 2018-10-29 DIAGNOSIS — M545 Low back pain: Secondary | ICD-10-CM | POA: Diagnosis not present

## 2018-10-29 DIAGNOSIS — M2560 Stiffness of unspecified joint, not elsewhere classified: Secondary | ICD-10-CM | POA: Diagnosis not present

## 2018-10-29 DIAGNOSIS — R293 Abnormal posture: Secondary | ICD-10-CM | POA: Diagnosis not present

## 2018-11-11 DIAGNOSIS — I10 Essential (primary) hypertension: Secondary | ICD-10-CM | POA: Diagnosis not present

## 2018-11-11 DIAGNOSIS — Z0000 Encounter for general adult medical examination without abnormal findings: Secondary | ICD-10-CM | POA: Diagnosis not present

## 2018-11-22 DIAGNOSIS — S91332A Puncture wound without foreign body, left foot, initial encounter: Secondary | ICD-10-CM | POA: Diagnosis not present

## 2018-11-24 DIAGNOSIS — L438 Other lichen planus: Secondary | ICD-10-CM | POA: Diagnosis not present

## 2019-02-23 DIAGNOSIS — Z1231 Encounter for screening mammogram for malignant neoplasm of breast: Secondary | ICD-10-CM | POA: Diagnosis not present

## 2019-02-23 DIAGNOSIS — Z01419 Encounter for gynecological examination (general) (routine) without abnormal findings: Secondary | ICD-10-CM | POA: Diagnosis not present

## 2019-02-23 DIAGNOSIS — Z6826 Body mass index (BMI) 26.0-26.9, adult: Secondary | ICD-10-CM | POA: Diagnosis not present

## 2019-02-25 DIAGNOSIS — L438 Other lichen planus: Secondary | ICD-10-CM | POA: Diagnosis not present

## 2023-07-16 ENCOUNTER — Ambulatory Visit
Admission: RE | Admit: 2023-07-16 | Discharge: 2023-07-16 | Disposition: A | Discharge status: Home / Self Care | Source: Ambulatory Visit | Attending: Physician Assistant | Admitting: Physician Assistant

## 2023-07-16 ENCOUNTER — Other Ambulatory Visit: Payer: Self-pay

## 2023-07-16 VITALS — BP 143/90 | HR 56 | Temp 98.1°F | Resp 16 | Ht 66.5 in | Wt 172.0 lb

## 2023-07-16 DIAGNOSIS — N390 Urinary tract infection, site not specified: Secondary | ICD-10-CM

## 2023-07-16 DIAGNOSIS — R350 Frequency of micturition: Secondary | ICD-10-CM | POA: Diagnosis present

## 2023-07-16 DIAGNOSIS — R319 Hematuria, unspecified: Secondary | ICD-10-CM | POA: Diagnosis not present

## 2023-07-16 LAB — POCT URINALYSIS DIP (MANUAL ENTRY)
Bilirubin, UA: NEGATIVE
Glucose, UA: NEGATIVE mg/dL
Ketones, POC UA: NEGATIVE mg/dL
Nitrite, UA: NEGATIVE
Spec Grav, UA: 1.02 (ref 1.010–1.025)
Urobilinogen, UA: 0.2 U/dL
pH, UA: 7 (ref 5.0–8.0)

## 2023-07-16 MED ORDER — NITROFURANTOIN MONOHYD MACRO 100 MG PO CAPS: 100.0000 mg | ORAL_CAPSULE | Freq: Two times a day (BID) | ORAL | 0 refills | Status: AC

## 2023-07-16 NOTE — ED Provider Notes (Signed)
 [facility]    CSN: [id number] Arrival date & time: 07/16/23  1553      History   Chief Complaint Chief Complaint  Patient presents with   Urinary Frequency    Entered by patient    HPI Andrea Arias is a 48 y.o. female.   HPI  She reports increased urinary frequency and urgency for the past 4 days as well as some urinary hesitancy  She denies fever or chills, flank pain,   Past Medical History:  Diagnosis Date   History of heavy periods    Hypertension     There are no active problems to display for this patient.   Past Surgical History:  Procedure Laterality Date   ENDOMETRIAL ABLATION  2009    OB History   No obstetric history on file.      Home Medications    Prior to Admission medications   Medication Sig Start Date End Date Taking? Authorizing Provider  amLODipine-valsartan (EXFORGE) 10-320 MG tablet Take 1 tablet by mouth daily. 04/12/22  Yes [provider]  nitrofurantoin, macrocrystal-monohydrate, (MACROBID) 100 MG capsule Take 1 capsule (100 mg total) by mouth 2 (two) times daily for 5 days. 07/16/23 07/21/23 Yes Lakesha Levinson E, PA-C  Valsartan (DIOVAN PO) Take by mouth.    [provider]  valsartan-hydrochlorothiazide (DIOVAN-HCT) 80-12.5 MG per tablet  12/30/11   [provider]    Family History Family History  Problem Relation Age of Onset   Diabetes Maternal Grandmother    Diabetes Paternal Grandfather     Social History Social History   Tobacco Use   Smoking status: Never   Smokeless tobacco: Never  Vaping Use   Vaping status: Never Used  Substance Use Topics   Alcohol use: No   Drug use: No     Allergies   Patient has no known allergies.   Review of Systems Review of Systems  Constitutional:  Negative for chills and fever.  Gastrointestinal:  Negative for abdominal pain.  Genitourinary:  Positive for frequency and urgency. Negative for difficulty urinating, dysuria, flank pain,  vaginal bleeding, vaginal discharge and vaginal pain.     Physical Exam Triage Vital Signs ED Triage Vitals  Encounter Vitals Group     BP 07/16/23 1610 (!) 143/90     Systolic BP Percentile --      Diastolic BP Percentile --      Pulse Rate 07/16/23 1610 (!) 56     Resp 07/16/23 1610 16     Temp 07/16/23 1610 98.1 F (36.7 C)     Temp Source 07/16/23 1610 Oral     SpO2 07/16/23 1610 98 %     Weight 07/16/23 1628 172 lb (78 kg)     Height 07/16/23 1628 5' 6.5" (1.689 m)     Head Circumference --      Peak Flow --      Pain Score 07/16/23 1628 0     Pain Loc --      Pain Education --      Exclude from Growth Chart --    No data found.  Updated Vital Signs BP (!) 143/90 (BP Location: Right Arm)   Pulse (!) 56   Temp 98.1 F (36.7 C) (Oral)   Resp 16   Ht 5' 6.5" (1.689 m)   Wt 172 lb (78 kg)   SpO2 98%   BMI 27.35 kg/m   Visual Acuity Right Eye Distance:   Left Eye Distance:  Bilateral Distance:    Right Eye Near:   Left Eye Near:    Bilateral Near:     Physical Exam Vitals reviewed.  Constitutional:      General: She is awake.     Appearance: Normal appearance. She is well-developed and well-groomed.  HENT:     Head: Normocephalic and atraumatic.  Eyes:     General: Lids are normal. Gaze aligned appropriately.     Extraocular Movements: Extraocular movements intact.     Conjunctiva/sclera: Conjunctivae normal.  Pulmonary:     Effort: Pulmonary effort is normal.  Neurological:     General: No focal deficit present.     Mental Status: She is alert and oriented to person, place, and time.     GCS: GCS eye subscore is 4. GCS verbal subscore is 5. GCS motor subscore is 6.     Cranial Nerves: No cranial nerve deficit, dysarthria or facial asymmetry.  Psychiatric:        Attention and Perception: Attention and perception normal.        Mood and Affect: Mood and affect normal.        Speech: Speech normal.        Behavior: Behavior normal. Behavior is  cooperative.      UC Treatments / Results  Labs (all labs ordered are listed, but only abnormal results are displayed) Labs Reviewed  POCT URINALYSIS DIP (MANUAL ENTRY) - Abnormal; Notable for the following components:      Result Value   Color, UA light yellow (*)    Clarity, UA cloudy (*)    Blood, UA moderate (*)    Protein Ur, POC trace (*)    Leukocytes, UA Small (1+) (*)    All other components within normal limits  URINE CULTURE    EKG   Radiology No results found.  Procedures Procedures (including critical care time)  Medications Ordered in UC Medications - No data to display  Initial Impression / Assessment and Plan / UC Course  I have reviewed the triage vital signs and the nursing notes.  Pertinent labs & imaging results that were available during my care of the patient were reviewed by me and considered in my medical decision making (see chart for details).      Final Clinical Impressions(s) / UC Diagnoses   Final diagnoses:  Urinary frequency  Urinary tract infection with hematuria, site unspecified   Patient presents today with concerns for UTI due to symptoms of increased urinary frequency, urinary hesitancy and urgency.  She reports that the symptoms have been ongoing for about 4 days.  Urine dip is notable for leukocytes as well as red blood cells along with cloudy appearance.  Will send sample for culture for definitive ID and susceptibility testing.  Will start Macrobid p.o. twice daily x 5 days pending urine culture results.  Patient declines Pyridium as she reports she is not having that much discomfort at this time.  Recommend that she stays well-hydrated and avoid holding urine for prolonged periods of time.  ED and return precautions reviewed and provided in after visit summary.  Follow-up as needed    Discharge Instructions      Based on your symptoms and results of the urinalysis I believe you have a UTI I recommend the following:   I  have sent in a script for Macrobid 100 mg to be taken by mouth twice per day for 5 days   Please finish the entire course of the antibiotic  even if you are feeling better before it is completed unless you are instructed to stop or develop an allergic reaction.   Stay well hydrated (at least 75 oz of water per day) and avoid holding your urine If you have any of the following please go to the ED for evaluation: persistent symptoms, fever, trouble urinating or inability to urinate, confusion, flank pain.       ED Prescriptions     Medication Sig Dispense Auth. Provider   nitrofurantoin, macrocrystal-monohydrate, (MACROBID) 100 MG capsule Take 1 capsule (100 mg total) by mouth 2 (two) times daily for 5 days. 10 capsule Lenorris Karger E, PA-C      PDMP not reviewed this encounter.   Providence Crosby, PA-C 07/16/23 1645

## 2023-07-16 NOTE — ED Triage Notes (Signed)
 Pt presents with complaints of urinary frequency x 4 days. Pt currently denies pain. Pt states she feels like she is not able to fully empty her bladder. Pt denies taking medications for symptoms and denies fevers at home.

## 2023-07-16 NOTE — Discharge Instructions (Signed)
 Based on your symptoms and results of the urinalysis I believe you have a UTI I recommend the following:   I have sent in a script for Macrobid 100 mg to be taken by mouth twice per day for 5 days   Please finish the entire course of the antibiotic even if you are feeling better before it is completed unless you are instructed to stop or develop an allergic reaction.   Stay well hydrated (at least 75 oz of water per day) and avoid holding your urine If you have any of the following please go to the ED for evaluation: persistent symptoms, fever, trouble urinating or inability to urinate, confusion, flank pain.

## 2023-07-17 LAB — URINE CULTURE: Culture: NO GROWTH
